# Patient Record
Sex: Female | Born: 1950 | Race: White | Hispanic: No | Smoking: Current every day smoker
Health system: Southern US, Community
[De-identification: ages and names within clinical notes are randomized; demographics above are authoritative.]

## PROBLEM LIST (undated history)

## (undated) DIAGNOSIS — N814 Uterovaginal prolapse, unspecified: Secondary | ICD-10-CM

---

## 2012-11-19 ENCOUNTER — Ambulatory Visit: Payer: Self-pay | Admitting: Family Medicine

## 2015-03-12 ENCOUNTER — Emergency Department: Payer: Self-pay

## 2015-03-12 ENCOUNTER — Emergency Department
Admission: EM | Admit: 2015-03-12 | Discharge: 2015-03-12 | Disposition: A | Discharge status: Home / Self Care | Payer: PRIVATE HEALTH INSURANCE | Attending: Emergency Medicine | Admitting: Emergency Medicine

## 2015-03-12 ENCOUNTER — Encounter: Payer: Self-pay | Admitting: Emergency Medicine

## 2015-03-12 ENCOUNTER — Ambulatory Visit
Admission: EM | Admit: 2015-03-12 | Discharge: 2015-03-12 | Disposition: A | Discharge status: Home / Self Care | Payer: PRIVATE HEALTH INSURANCE | Attending: Family Medicine | Admitting: Family Medicine

## 2015-03-12 DIAGNOSIS — M542 Cervicalgia: Secondary | ICD-10-CM

## 2015-03-12 DIAGNOSIS — Y92410 Unspecified street and highway as the place of occurrence of the external cause: Secondary | ICD-10-CM | POA: Insufficient documentation

## 2015-03-12 DIAGNOSIS — R51 Headache: Secondary | ICD-10-CM | POA: Diagnosis not present

## 2015-03-12 DIAGNOSIS — Z79899 Other long term (current) drug therapy: Secondary | ICD-10-CM | POA: Insufficient documentation

## 2015-03-12 DIAGNOSIS — Y998 Other external cause status: Secondary | ICD-10-CM | POA: Insufficient documentation

## 2015-03-12 DIAGNOSIS — Z720 Tobacco use: Secondary | ICD-10-CM | POA: Insufficient documentation

## 2015-03-12 DIAGNOSIS — S0990XA Unspecified injury of head, initial encounter: Secondary | ICD-10-CM

## 2015-03-12 DIAGNOSIS — S199XXA Unspecified injury of neck, initial encounter: Secondary | ICD-10-CM | POA: Insufficient documentation

## 2015-03-12 DIAGNOSIS — Y9389 Activity, other specified: Secondary | ICD-10-CM | POA: Insufficient documentation

## 2015-03-12 HISTORY — DX: Uterovaginal prolapse, unspecified: N81.4
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
## 2015-03-12 NOTE — ED Notes (Signed)
Dr. Kinner at the bedside for pt evaluation.  
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
## 2015-03-12 NOTE — Discharge Instructions (Signed)
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
## 2019-07-20 ENCOUNTER — Other Ambulatory Visit: Payer: Self-pay

## 2019-07-20 ENCOUNTER — Encounter: Payer: Self-pay | Admitting: Emergency Medicine

## 2019-07-20 ENCOUNTER — Ambulatory Visit
Admission: EM | Admit: 2019-07-20 | Discharge: 2019-07-20 | Disposition: A | Discharge status: Home / Self Care | Payer: Medicare Other | Attending: Family Medicine | Admitting: Family Medicine

## 2019-07-20 DIAGNOSIS — Z7189 Other specified counseling: Secondary | ICD-10-CM | POA: Diagnosis present

## 2019-07-20 DIAGNOSIS — F17210 Nicotine dependence, cigarettes, uncomplicated: Secondary | ICD-10-CM

## 2019-07-20 DIAGNOSIS — Z20828 Contact with and (suspected) exposure to other viral communicable diseases: Secondary | ICD-10-CM | POA: Insufficient documentation

## 2019-07-20 DIAGNOSIS — J069 Acute upper respiratory infection, unspecified: Secondary | ICD-10-CM

## 2019-07-20 DIAGNOSIS — Z20822 Contact with and (suspected) exposure to covid-19: Secondary | ICD-10-CM

## 2019-07-20 NOTE — Discharge Instructions (Signed)
Rest. Drink plenty of fluids.  ° °Follow up with your primary care physician this week as needed. Return to Urgent care for new or worsening concerns.  ° °

## 2019-07-20 NOTE — ED Triage Notes (Signed)
Patient states she was exposed to [place]. Patient states she is having a headache, cough, runny nose that started 2 days ago.

## 2019-07-20 NOTE — ED Provider Notes (Signed)
[facility] ____________________________________________  Time seen: Approximately 2:56 PM  I have reviewed the triage vital signs and the nursing notes.   HISTORY  Chief Complaint Cough, Headache, and Nasal Congestion   HPI Natalie Henry is a 68 y.o. female presenting for COVID-19 testing.  Patient reports that she does have mild nasal congestion, intermittent headache and cough, but further states this is not too atypical for her for this time a year.  However reports she wanted COVID-19 testing as her granddaughter who lives with her is also not feeling well.  Reports that her granddaughter did have a COVID-19 exposure but patient did not have a direct COVID-19 exposure.  Occasional scratchy throat.  Denies chest pain, shortness of breath, fevers, changes in taste or smell, vomiting or diarrhea.  Denies aggravating or alleviating factors.  States she has not needed take any medication over-the-counter for the same complaints.   Past Medical History:  Diagnosis Date  . Uterine prolapse     There are no problems to display for this patient.   History reviewed. No pertinent surgical history.   No current facility-administered medications for this encounter.  Current Outpatient Medications:  .  Ascorbic Acid (VITAMIN C) 1000 MG tablet, Take 2,000 mg by mouth daily., Disp: , Rfl:  .  Biotin 2500 MCG CAPS, Take 5,000 mcg by mouth every other day., Disp: , Rfl:  .  omega-3 acid ethyl esters (LOVAZA) 1 G capsule, Take 1,200 mg by mouth 2 (two) times daily., Disp: , Rfl:  .  Vitamin D, Ergocalciferol, (DRISDOL) 50000 UNITS CAPS capsule, Take 50,000 Units by mouth every other day., Disp: , Rfl:  .  zinc gluconate 50 MG tablet, Take 50 mg by mouth daily., Disp: , Rfl:   Allergies Prednisone  Family History  Problem Relation Age of Onset  . Diabetes Mother   . Cancer Father     Social History Social History   Tobacco Use  . Smoking status: Current Every Day  Smoker  . Smokeless tobacco: Never Used  Substance Use Topics  . Alcohol use: No  . Drug use: Never    Review of Systems Constitutional: No fever ENT:As above.  Cardiovascular: Denies chest pain. Respiratory: Denies shortness of breath. Gastrointestinal: No abdominal pain.  No nausea, no vomiting.  No diarrhea.  Musculoskeletal: Negative for back pain. Skin: Negative for rash.   ____________________________________________   PHYSICAL EXAM:  VITAL SIGNS: ED Triage Vitals  Enc Vitals Group     BP 07/20/19 1443 (!) 147/64     Pulse Rate 07/20/19 1443 74     Resp 07/20/19 1443 18     Temp 07/20/19 1443 98.2 F (36.8 C)     Temp src --      SpO2 07/20/19 1443 99 %     Weight 07/20/19 1441 160 lb (72.6 kg)     Height 07/20/19 1441 5\' 2"  (1.575 m)     Head Circumference --      Peak Flow --      Pain Score 07/20/19 1441 0     Pain Loc --      Pain Edu? --      Excl. in GC? --     Constitutional: Alert and oriented. Well appearing and in no acute distress. Eyes: Conjunctivae are normal Head: Atraumatic. No sinus tenderness to palpation. No swelling. No erythema.  Ears: no erythema, normal TMs bilaterally.   Nose:Nasal congestion   Mouth/Throat: Mucous membranes are moist. No pharyngeal erythema. No  tonsillar swelling or exudate.  Neck: No stridor.  No cervical spine tenderness to palpation. Hematological/Lymphatic/Immunilogical: No cervical lymphadenopathy. Cardiovascular: Normal rate, regular rhythm. Grossly normal heart sounds.  Good peripheral circulation. Respiratory: Normal respiratory effort.  No retractions. No wheezes, rales or rhonchi. Good air movement.  Musculoskeletal: Ambulatory with steady gait. Neurologic:  Normal speech and language. No gait instability. Skin:  Skin appears warm, dry and intact. No rash noted. Psychiatric: Mood and affect are normal. Speech and behavior are normal.  ___________________________________________   LABS (all labs ordered  are listed, but only abnormal results are displayed)  Labs Reviewed  NOVEL CORONAVIRUS, NAA (HOSP ORDER, SEND-OUT TO REF LAB; TAT 18-24 HRS)   ____________________________________________   PROCEDURES Procedures    INITIAL IMPRESSION / ASSESSMENT AND PLAN / ED COURSE  Pertinent labs & imaging results that were available during my care of the patient were reviewed by me and considered in my medical decision making (see chart for details).  Well-appearing patient.  No acute distress.  COVID-19 testing completed and advice given.  Work note given.  Rest, fluids, supportive care monitor.  Discussed follow up with Primary care physician this week as needed. Discussed follow up and return parameters including no resolution or any worsening concerns. Patient verbalized understanding and agreed to plan.   ____________________________________________   FINAL CLINICAL IMPRESSION(S) / ED DIAGNOSES  Final diagnoses:  Upper respiratory tract infection, unspecified type  Advice given about COVID-19 virus infection  Exposure to COVID-19 virus     ED Discharge Orders    None       Note: This dictation was prepared with Dragon dictation along with smaller phrase technology. Any transcriptional errors that result from this process are unintentional.         Marylene Land, NP 07/20/19 [phone]

## 2019-07-21 LAB — NOVEL CORONAVIRUS, NAA (HOSP ORDER, SEND-OUT TO REF LAB; TAT 18-24 HRS): SARS-CoV-2, NAA: NOT DETECTED

## 2019-11-18 ENCOUNTER — Ambulatory Visit
Admission: EM | Admit: 2019-11-18 | Discharge: 2019-11-18 | Disposition: A | Discharge status: Home / Self Care | Payer: Medicare Other | Attending: Family Medicine | Admitting: Family Medicine

## 2019-11-18 ENCOUNTER — Encounter: Payer: Self-pay | Admitting: Emergency Medicine

## 2019-11-18 ENCOUNTER — Other Ambulatory Visit: Payer: Self-pay

## 2019-11-18 DIAGNOSIS — Z20822 Contact with and (suspected) exposure to covid-19: Secondary | ICD-10-CM | POA: Diagnosis not present

## 2019-11-18 NOTE — ED Triage Notes (Signed)
Patient states that her coworker tested positive for COVID on Tuesday.  Patient reports some fatigue but denies cold symptoms.  Patient denies fevers.

## 2019-11-18 NOTE — Discharge Instructions (Addendum)
Self isolate and Await covid test

## 2019-11-18 NOTE — ED Provider Notes (Signed)
[facility]    CSN: [id number] Arrival date & time: 11/18/19  1322      History   Chief Complaint Chief Complaint  Patient presents with  . Fatigue    COVID Exposure    HPI Natalie Henry is a 69 y.o. female.   69 yo female here for covid test after exposure through covid positive co-worker this week. States she feels slightly fatigued but not significant and denies any other symptoms.      Past Medical History:  Diagnosis Date  . Uterine prolapse     There are no problems to display for this patient.   History reviewed. No pertinent surgical history.  OB History   No obstetric history on file.      Home Medications    Prior to Admission medications   Medication Sig Start Date End Date Taking? Authorizing Provider  Ascorbic Acid (VITAMIN C) 1000 MG tablet Take 2,000 mg by mouth daily.   Yes [provider]  Vitamin D, Ergocalciferol, (DRISDOL) 50000 UNITS CAPS capsule Take 50,000 Units by mouth every other day.   Yes [provider]  Biotin 2500 MCG CAPS Take 5,000 mcg by mouth every other day.    [provider]  omega-3 acid ethyl esters (LOVAZA) 1 G capsule Take 1,200 mg by mouth 2 (two) times daily.    [provider]  zinc gluconate 50 MG tablet Take 50 mg by mouth daily.    [provider]    Family History Family History  Problem Relation Age of Onset  . Diabetes Mother   . Cancer Father     Social History Social History   Tobacco Use  . Smoking status: Current Every Day Smoker    Types: Cigarettes  . Smokeless tobacco: Never Used  Substance Use Topics  . Alcohol use: No  . Drug use: Never     Allergies   Prednisone   Review of Systems Review of Systems   Physical Exam Triage Vital Signs ED Triage Vitals  Enc Vitals Group     BP 11/18/19 1338 (!) 142/62     Pulse Rate 11/18/19 1338 71     Resp 11/18/19 1338 14     Temp 11/18/19 1338 98 F (36.7 C)     Temp Source  11/18/19 1338 Oral     SpO2 11/18/19 1338 100 %     Weight 11/18/19 1336 170 lb (77.1 kg)     Height 11/18/19 1336 5\' 2"  (1.575 m)     Head Circumference --      Peak Flow --      Pain Score 11/18/19 1336 0     Pain Loc --      Pain Edu? --      Excl. in [place]? --    No data found.  Updated Vital Signs BP (!) 142/62 (BP Location: Left Arm)   Pulse 71   Temp 98 F (36.7 C) (Oral)   Resp 14   Ht 5\' 2"  (1.575 m)   Wt 77.1 kg   SpO2 100%   BMI 31.09 kg/m   Visual Acuity Right Eye Distance:   Left Eye Distance:   Bilateral Distance:    Right Eye Near:   Left Eye Near:    Bilateral Near:     Physical Exam Vitals and nursing note reviewed.  Constitutional:      General: She is not in acute distress.    Appearance: Normal appearance. She is not  ill-appearing, toxic-appearing or diaphoretic.  Pulmonary:     Effort: Pulmonary effort is normal.  Neurological:     Mental Status: She is alert.      UC Treatments / Results  Labs (all labs ordered are listed, but only abnormal results are displayed) Labs Reviewed  SARS CORONAVIRUS 2 (TAT 6-24 HRS)    EKG   Radiology No results found.  Procedures Procedures (including critical care time)  Medications Ordered in UC Medications - No data to display  Initial Impression / Assessment and Plan / UC Course  I have reviewed the triage vital signs and the nursing notes.  Pertinent labs & imaging results that were available during my care of the patient were reviewed by me and considered in my medical decision making (see chart for details).      Final Clinical Impressions(s) / UC Diagnoses   Final diagnoses:  Exposure to COVID-19 virus     Discharge Instructions     Self isolate and Await covid test    ED Prescriptions    None      1.  diagnosis reviewed with patien 2. Await covid test 3. Follow-up prn  PDMP not reviewed this encounter.   Payton Mccallum, MD 11/18/19 [phone]

## 2019-11-19 LAB — SARS CORONAVIRUS 2 (TAT 6-24 HRS): SARS Coronavirus 2: NEGATIVE

## 2020-07-13 ENCOUNTER — Encounter: Payer: Self-pay | Admitting: Emergency Medicine

## 2020-07-13 ENCOUNTER — Ambulatory Visit
Admission: EM | Admit: 2020-07-13 | Discharge: 2020-07-13 | Disposition: A | Discharge status: Home / Self Care | Payer: Medicare Other | Attending: Physician Assistant | Admitting: Physician Assistant

## 2020-07-13 ENCOUNTER — Other Ambulatory Visit: Payer: Self-pay

## 2020-07-13 DIAGNOSIS — H1012 Acute atopic conjunctivitis, left eye: Secondary | ICD-10-CM | POA: Diagnosis not present

## 2020-07-13 MED ORDER — NAPHAZOLINE-PHENIRAMINE 0.025-0.3 % OP SOLN: 2.0000 [drp] | Freq: Four times a day (QID) | OPHTHALMIC | 0 refills | Status: AC | PRN

## 2020-07-13 NOTE — ED Triage Notes (Signed)
Pt c/o left eye swelling, watering and itching. Started yesterday. She has not tried anything.

## 2020-07-13 NOTE — ED Provider Notes (Signed)
[facility]    CSN: [id number] Arrival date & time: 07/13/20  1159      History   Chief Complaint Chief Complaint  Patient presents with  . Eye Problem    left    HPI Natalie Henry is a 69 y.o. female presenting for concerns of left eye redness and itching since yesterday.  Patient admits to some clear to light yellow drainage and crusting.  She denies any photosensitivity.  She states it feels like her eye aches a little bit.  Denies pain with movement of the eye.  Denies tenderness of the eyelids.  Denies any vision changes.  Denies headaches or dizziness.  Patient denies any fever, fatigue, nasal congestion, cough or sore throat.  Patient believes she may have "pinkeye."  She has not tried any over-the-counter eyedrops or other medications for symptoms.  Patient is not a contact lens wearer.  She has no other complaints or concerns.  HPI  Past Medical History:  Diagnosis Date  . Uterine prolapse     There are no problems to display for this patient.   History reviewed. No pertinent surgical history.  OB History   No obstetric history on file.      Home Medications    Prior to Admission medications   Medication Sig Start Date End Date Taking? Authorizing Provider  Ascorbic Acid (VITAMIN C) 1000 MG tablet Take 2,000 mg by mouth daily.   Yes [provider]  Biotin 2500 MCG CAPS Take 5,000 mcg by mouth every other day.   Yes [provider]  naphazoline-pheniramine (NAPHCON-A) 0.025-0.3 % ophthalmic solution Place 2 drops into the left eye 4 (four) times daily as needed for up to 7 days for eye irritation. 07/13/20 07/20/20  Eusebio Friendly B, PA-C  omega-3 acid ethyl esters (LOVAZA) 1 G capsule Take 1,200 mg by mouth 2 (two) times daily.    [provider]  Vitamin D, Ergocalciferol, (DRISDOL) 50000 UNITS CAPS capsule Take 50,000 Units by mouth every other day.    [provider]  zinc gluconate 50 MG tablet Take 50 mg by  mouth daily.    [provider]    Family History Family History  Problem Relation Age of Onset  . Diabetes Mother   . Cancer Father     Social History Social History   Tobacco Use  . Smoking status: Current Every Day Smoker    Types: Cigarettes  . Smokeless tobacco: Never Used  Vaping Use  . Vaping Use: Never used  Substance Use Topics  . Alcohol use: No  . Drug use: Never     Allergies   Prednisone   Review of Systems Review of Systems  Constitutional: Negative for fatigue and fever.  HENT: Negative for congestion, rhinorrhea and sore throat.   Eyes: Positive for pain, discharge, redness and itching. Negative for photophobia and visual disturbance.  Respiratory: Negative for cough and shortness of breath.   Gastrointestinal: Negative for nausea and vomiting.  Skin: Negative for rash.  Neurological: Negative for dizziness, weakness and headaches.  Hematological: Negative for adenopathy.     Physical Exam Triage Vital Signs ED Triage Vitals  Enc Vitals Group     BP 07/13/20 1227 (!) 159/69     Pulse Rate 07/13/20 1227 77     Resp 07/13/20 1227 18     Temp 07/13/20 1227 97.8 F (36.6 C)     Temp Source 07/13/20 1227 Oral     SpO2 07/13/20 1227  99 %     Weight 07/13/20 1225 169 lb 15.6 oz (77.1 kg)     Height 07/13/20 1225 5\' 2"  (1.575 m)     Head Circumference --      Peak Flow --      Pain Score 07/13/20 1225 0     Pain Loc --      Pain Edu? --      Excl. in [place]? --    No data found.  Updated Vital Signs BP (!) 159/69 (BP Location: Left Arm)   Pulse 77   Temp 97.8 F (36.6 C) (Oral)   Resp 18   Ht 5\' 2"  (1.575 m)   Wt 169 lb 15.6 oz (77.1 kg)   SpO2 99%   BMI 31.09 kg/m   Visual Acuity Right Eye Distance: 20/30 uncorrected Left Eye Distance: 20/40 uncorrected Bilateral Distance: 20/40 uncorrected  Right Eye Near:   Left Eye Near:    Bilateral Near:     Physical Exam Vitals and nursing note reviewed.  Constitutional:       General: She is not in acute distress.    Appearance: Normal appearance. She is not ill-appearing or toxic-appearing.  HENT:     Head: Normocephalic and atraumatic.     Nose: Nose normal.     Mouth/Throat:     Mouth: Mucous membranes are moist.     Pharynx: Oropharynx is clear.  Eyes:     General: Lids are normal. Vision grossly intact. No scleral icterus.       Left eye: No foreign body or discharge.     Extraocular Movements:     Left eye: Normal extraocular motion.     Conjunctiva/sclera:     Left eye: Left conjunctiva is injected (slight injection medial corner with slight erythema, no drainage.).     Pupils: Pupils are equal, round, and reactive to light.  Cardiovascular:     Rate and Rhythm: Normal rate and regular rhythm.     Heart sounds: Normal heart sounds.  Pulmonary:     Effort: Pulmonary effort is normal. No respiratory distress.     Breath sounds: Normal breath sounds.  Musculoskeletal:     Cervical back: Neck supple.  Skin:    General: Skin is dry.  Neurological:     General: No focal deficit present.     Mental Status: She is alert. Mental status is at baseline.     Motor: No weakness.     Gait: Gait normal.  Psychiatric:        Mood and Affect: Mood normal.        Behavior: Behavior normal.        Thought Content: Thought content normal.      UC Treatments / Results  Labs (all labs ordered are listed, but only abnormal results are displayed) Labs Reviewed - No data to display  EKG   Radiology No results found.  Procedures Procedures (including critical care time)  Medications Ordered in UC Medications - No data to display  Initial Impression / Assessment and Plan / UC Course  I have reviewed the triage vital signs and the nursing notes.  Pertinent labs & imaging results that were available during my care of the patient were reviewed by me and considered in my medical decision making (see chart for details).   Patient's exam and symptoms  are most consistent with allergic conjunctivitis.  I do not suspect bacterial conjunctivitis at this time.  I have sent Naphcon-A to pharmacy.  Advised her to call or return to clinic if any symptoms are worsening.  ED precautions reviewed.  Final Clinical Impressions(s) / UC Diagnoses   Final diagnoses:  Allergic conjunctivitis of left eye   Discharge Instructions   None    ED Prescriptions    Medication Sig Dispense Auth. Provider   naphazoline-pheniramine (NAPHCON-A) 0.025-0.3 % ophthalmic solution Place 2 drops into the left eye 4 (four) times daily as needed for up to 7 days for eye irritation. 15 mL Shirlee Latch, PA-C     PDMP not reviewed this encounter.   Shirlee Latch, PA-C 07/13/20 1248

## 2020-08-12 ENCOUNTER — Other Ambulatory Visit: Payer: Self-pay

## 2020-08-12 ENCOUNTER — Ambulatory Visit
Admission: EM | Admit: 2020-08-12 | Discharge: 2020-08-12 | Disposition: A | Discharge status: Home / Self Care | Payer: Medicare Other | Attending: Physician Assistant | Admitting: Physician Assistant

## 2020-08-12 ENCOUNTER — Encounter: Payer: Self-pay | Admitting: Emergency Medicine

## 2020-08-12 DIAGNOSIS — U071 COVID-19: Secondary | ICD-10-CM | POA: Insufficient documentation

## 2020-08-12 DIAGNOSIS — R5383 Other fatigue: Secondary | ICD-10-CM | POA: Insufficient documentation

## 2020-08-12 DIAGNOSIS — J3489 Other specified disorders of nose and nasal sinuses: Secondary | ICD-10-CM | POA: Diagnosis present

## 2020-08-12 DIAGNOSIS — Z20822 Contact with and (suspected) exposure to covid-19: Secondary | ICD-10-CM | POA: Diagnosis present

## 2020-08-12 DIAGNOSIS — B349 Viral infection, unspecified: Secondary | ICD-10-CM | POA: Insufficient documentation

## 2020-08-12 NOTE — Discharge Instructions (Addendum)

## 2020-08-12 NOTE — ED Provider Notes (Signed)
[facility]    CSN: [id number] Arrival date & time: 08/12/20  1311      History   Chief Complaint Chief Complaint  Patient presents with   Sinus Problem    HPI KEELYN MONJARAS is a 70 y.o. female presenting for 3-day history of nasal congestion, sinus pressure and low-grade fevers. Also admits to headaches and pain of the right side of her neck behind her right ear. Admits to temps up to 99.7 degrees. Patient says that her grandson who lives with her house COVID-19. She states she has been fully vaccinated for COVID-19.  Patient denies any high-grade fever, cough, chest pain, difficulty breathing, weakness, pain, nausea/vomiting or diarrhea.  Patient denies any chronic health problems.  No other current concerns.  HPI  Past Medical History:  Diagnosis Date   Uterine prolapse     There are no problems to display for this patient.   History reviewed. No pertinent surgical history.  OB History   No obstetric history on file.      Home Medications    Prior to Admission medications   Medication Sig Start Date End Date Taking? Authorizing Provider  Ascorbic Acid (VITAMIN C) 1000 MG tablet Take 2,000 mg by mouth daily.   Yes [provider]  Biotin 2500 MCG CAPS Take 5,000 mcg by mouth every other day.    [provider]  omega-3 acid ethyl esters (LOVAZA) 1 G capsule Take 1,200 mg by mouth 2 (two) times daily.    [provider]  Vitamin D, Ergocalciferol, (DRISDOL) 50000 UNITS CAPS capsule Take 50,000 Units by mouth every other day.    [provider]  zinc gluconate 50 MG tablet Take 50 mg by mouth daily.    [provider]    Family History Family History  Problem Relation Age of Onset   Diabetes Mother    Cancer Father     Social History Social History   Tobacco Use   Smoking status: Current Every Day Smoker    Types: Cigarettes   Smokeless tobacco: Never Used  Building services engineer Use: Never used   Substance Use Topics   Alcohol use: No   Drug use: Never     Allergies   Prednisone   Review of Systems Review of Systems  Constitutional: Positive for fatigue. Negative for chills, diaphoresis and fever.  HENT: Positive for congestion, rhinorrhea and sinus pressure. Negative for ear pain, sinus pain and sore throat.   Respiratory: Negative for cough and shortness of breath.   Cardiovascular: Negative for chest pain.  Gastrointestinal: Negative for abdominal pain, nausea and vomiting.  Musculoskeletal: Negative for arthralgias and myalgias.  Skin: Negative for rash.  Neurological: Positive for headaches. Negative for weakness.  Hematological: Negative for adenopathy.     Physical Exam Triage Vital Signs ED Triage Vitals  Enc Vitals Group     BP 08/12/20 1352 128/64     Pulse Rate 08/12/20 1352 63     Resp 08/12/20 1352 14     Temp 08/12/20 1352 97.7 F (36.5 C)     Temp Source 08/12/20 1352 Oral     SpO2 08/12/20 1352 100 %     Weight 08/12/20 1349 170 lb (77.1 kg)     Height 08/12/20 1349 5\' 2"  (1.575 m)     Head Circumference --      Peak Flow --      Pain Score 08/12/20 1349 5     Pain Loc --  Pain Edu? --      Excl. in GC? --    No data found.  Updated Vital Signs BP 128/64 (BP Location: Left Arm)    Pulse 63    Temp 97.7 F (36.5 C) (Oral)    Resp 14    Ht 5\' 2"  (1.575 m)    Wt 170 lb (77.1 kg)    SpO2 100%    BMI 31.09 kg/m       Physical Exam Vitals and nursing note reviewed.  Constitutional:      General: She is not in acute distress.    Appearance: Normal appearance. She is not ill-appearing or toxic-appearing.  HENT:     Head: Normocephalic and atraumatic.     Right Ear: Tympanic membrane, ear canal and external ear normal.     Left Ear: Tympanic membrane, ear canal and external ear normal.     Nose: Congestion and rhinorrhea present.     Mouth/Throat:     Mouth: Mucous membranes are moist.     Pharynx: Oropharynx is clear. No  posterior oropharyngeal erythema.  Eyes:     General: No scleral icterus.       Right eye: No discharge.        Left eye: No discharge.     Conjunctiva/sclera: Conjunctivae normal.  Cardiovascular:     Rate and Rhythm: Normal rate and regular rhythm.     Heart sounds: Normal heart sounds.  Pulmonary:     Effort: Pulmonary effort is normal. No respiratory distress.     Breath sounds: Normal breath sounds.  Musculoskeletal:     Cervical back: Neck supple.  Lymphadenopathy:     Cervical: No cervical adenopathy.  Skin:    General: Skin is dry.  Neurological:     General: No focal deficit present.     Mental Status: She is alert. Mental status is at baseline.     Motor: No weakness.     Gait: Gait normal.  Psychiatric:        Mood and Affect: Mood normal.        Behavior: Behavior normal.        Thought Content: Thought content normal.      UC Treatments / Results  Labs (all labs ordered are listed, but only abnormal results are displayed) Labs Reviewed  SARS CORONAVIRUS 2 (TAT 6-24 HRS)    EKG   Radiology No results found.  Procedures Procedures (including critical care time)  Medications Ordered in UC Medications - No data to display  Initial Impression / Assessment and Plan / UC Course  I have reviewed the triage vital signs and the nursing notes.  Pertinent labs & imaging results that were available during my care of the patient were reviewed by me and considered in my medical decision making (see chart for details).   All vital signs are normal and stable.  She is not ill-appearing.  Suspect viral illness.  Advised patient that he very well could have COVID-19 given his symptoms and exposure to her grandson who is positive.  COVID testing obtained.  Patient already had x-ray results.  CDC guidelines, isolation GERD, and ED precautions reviewed with patient.  Advised supportive care with increasing rest and fluids.  Advised to get over-the-counter Mucinex.   Advised to follow-up with our clinic as needed.   Final Clinical Impressions(s) / UC Diagnoses   Final diagnoses:  Viral illness  Sinus pressure  Fatigue, unspecified type  Exposure to COVID-19 virus  Discharge Instructions     You have received COVID testing today either for positive exposure, concerning symptoms that could be related to COVID infection, screening purposes, or re-testing after confirmed positive.  Your test obtained today checks for active viral infection in the last 1-2 weeks. If your test is negative now, you can still test positive later. So, if you do develop symptoms you should either get re-tested and/or isolate x 5 days and then strict mask use x 5 days (unvaccinated) or mask use x 10 days (vaccinated). Please follow CDC guidelines.  While Rapid antigen tests come back in 15-20 minutes, send out PCR/molecular test results typically come back within 1-3 days. In the mean time, if you are symptomatic, assume this could be a positive test and treat/monitor yourself as if you do have COVID.   We will call with test results if positive. Please download the MyChart app and set up a profile to access test results.   If symptomatic, go home and rest. Push fluids. Take Tylenol as needed for discomfort. Gargle warm salt water. Throat lozenges. Take Mucinex DM or Robitussin for cough. Humidifier in bedroom to ease coughing. Warm showers. Also review the COVID handout for more information.  COVID-19 INFECTION: The incubation period of COVID-19 is approximately 14 days after exposure, with most symptoms developing in roughly 4-5 days. Symptoms may range in severity from mild to critically severe. Roughly 80% of those infected will have mild symptoms. People of any age may become infected with COVID-19 and have the ability to transmit the virus. The most common symptoms include: fever, fatigue, cough, body aches, headaches, sore throat, nasal congestion, shortness of breath,  nausea, vomiting, diarrhea, changes in smell and/or taste.    COURSE OF ILLNESS Some patients may begin with mild disease which can progress quickly into critical symptoms. If your symptoms are worsening please call ahead to the Emergency Department and proceed there for further treatment. Recovery time appears to be roughly 1-2 weeks for mild symptoms and 3-6 weeks for severe disease.   GO IMMEDIATELY TO ER FOR FEVER YOU ARE UNABLE TO GET DOWN WITH TYLENOL, BREATHING PROBLEMS, CHEST PAIN, FATIGUE, LETHARGY, INABILITY TO EAT OR DRINK, ETC  QUARANTINE AND ISOLATION: To help decrease the spread of COVID-19 please remain isolated if you have COVID infection or are highly suspected to have COVID infection. This means -stay home and isolate to one room in the home if you live with others. Do not share a bed or bathroom with others while ill, sanitize and wipe down all countertops and keep common areas clean and disinfected. Stay home for 5 days. If you have no symptoms or your symptoms are resolving after 5 days, you can leave your house. Continue to wear a mask around others for 5 additional days. If you have been in close contact (within 6 feet) of someone diagnosed with COVID 19, you are advised to quarantine in your home for 14 days as symptoms can develop anywhere from 2-14 days after exposure to the virus. If you develop symptoms, you  must isolate.  Most current guidelines for COVID after exposure -unvaccinated: isolate 5 days and strict mask use x 5 days. Test on day 5 is possible -vaccinated: wear mask x 10 days if symptoms do not develop -You do not necessarily need to be tested for COVID if you have + exposure and  develop symptoms. Just isolate at home x10 days from symptom onset During this global pandemic, CDC advises to practice social  distancing, try to stay at least 63ft away from others at all times. Wear a face covering. Wash and sanitize your hands regularly and avoid going anywhere that  is not necessary.  KEEP IN MIND THAT THE COVID TEST IS NOT 100% ACCURATE AND YOU SHOULD STILL DO EVERYTHING TO PREVENT POTENTIAL SPREAD OF VIRUS TO OTHERS (WEAR MASK, WEAR GLOVES, WASH HANDS AND SANITIZE REGULARLY). IF INITIAL TEST IS NEGATIVE, THIS MAY NOT MEAN YOU ARE DEFINITELY NEGATIVE. MOST ACCURATE TESTING IS DONE 5-7 DAYS AFTER EXPOSURE.   It is not advised by CDC to get re-tested after receiving a positive COVID test since you can still test positive for weeks to months after you have already cleared the virus.   *If you have not been vaccinated for COVID, I strongly suggest you consider getting vaccinated as long as there are no contraindications.      ED Prescriptions    None     PDMP not reviewed this encounter.   Shirlee Latch, PA-C 08/14/20 1111

## 2020-08-12 NOTE — ED Triage Notes (Signed)
Patient sinus congestion and pressure and low grade fever that started 3 days ago.

## 2020-08-13 LAB — SARS CORONAVIRUS 2 (TAT 6-24 HRS): SARS Coronavirus 2: POSITIVE — AB

## 2022-07-04 ENCOUNTER — Ambulatory Visit
Admission: EM | Admit: 2022-07-04 | Discharge: 2022-07-04 | Disposition: A | Discharge status: Home / Self Care | Payer: Medicare Other

## 2022-07-04 ENCOUNTER — Encounter: Payer: Self-pay | Admitting: Emergency Medicine

## 2022-07-04 DIAGNOSIS — R079 Chest pain, unspecified: Secondary | ICD-10-CM

## 2022-07-04 NOTE — Discharge Instructions (Addendum)
Recommended patient obtain a home blood pressure cuff to monitor her symptoms and to inform her vascular surgeon of her findings.

## 2022-07-04 NOTE — ED Triage Notes (Signed)
Patient states that she was discharged from [facility] on Wednesday. Patient states that she was diagnosed with a ulcerated aorta and discharged home on Carvedilol.  Patient states that she was given this in the hospital.  Patient states that since taking the medicine at home she started having persistent chest pain and dizziness.

## 2022-07-04 NOTE — ED Provider Notes (Addendum)
[facility]    CSN: [id number] Arrival date & time: 07/04/22  1559      History   Chief Complaint Chief Complaint  Patient presents with   Chest Pain    HPI Natalie Henry is a 71 y.o. female.    Chest Pain   Patient presents to urgent care following discharge from [facility] on Wednesday.  She has concerns about the dose of a blood pressure medication she was given on discharge.  Patient states she was diagnosed with "ulcerated aorta" and discharged on carvedilol.  She endorses feeling of dizziness and a persistent "feeling" in her chest that she describes as tingling.  She has not measured blood pressure at home.  Blood pressure measured in clinic is 156/64, SpO2 equals 99.  Heart rate 72.  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  She has scheduled follow-up with vascular surgery on 08/01/2022.  Past Medical History:  Diagnosis Date   Uterine prolapse     There are no problems to display for this patient.   History reviewed. No pertinent surgical history.  OB History   No obstetric history on file.      Home Medications    Prior to Admission medications   Medication Sig Start Date End Date Taking? Authorizing Provider  aspirin 81 MG chewable tablet Chew by mouth. 07/03/22 08/02/22 Yes [provider]  carvedilol (COREG) 6.25 MG tablet Take by mouth. 07/02/22 08/01/22 Yes [provider]  Ascorbic Acid (VITAMIN C) 1000 MG tablet Take 2,000 mg by mouth daily.    [provider]  Biotin 2500 MCG CAPS Take 5,000 mcg by mouth every other day.    [provider]  omega-3 acid ethyl esters (LOVAZA) 1 G capsule Take 1,200 mg by mouth 2 (two) times daily.    [provider]  Vitamin D, Ergocalciferol, (DRISDOL) 50000 UNITS CAPS capsule Take 50,000 Units by mouth every other day.    [provider]  zinc gluconate 50 MG tablet Take 50 mg by mouth daily.    [provider]    Family History Family History  Problem Relation Age of Onset   Diabetes Mother    Cancer Father     Social History Social History   Tobacco Use   Smoking status: Every Day    Types: Cigarettes   Smokeless tobacco: Never  Vaping Use   Vaping Use: Never used  Substance Use Topics   Alcohol use: No   Drug use: Never     Allergies   Prednisone   Review of Systems Review of Systems  Cardiovascular:  Positive for chest pain.     Physical Exam Triage Vital Signs ED Triage Vitals  Enc Vitals Group     BP 07/04/22 1630 (!) 156/64     Pulse Rate 07/04/22 1630 72     Resp 07/04/22 1630 14     Temp 07/04/22 1630 98.4 F (36.9 C)     Temp Source 07/04/22 1630 Oral     SpO2 07/04/22 1630 99 %     Weight 07/04/22 1628 169 lb 15.6 oz (77.1 kg)     Height 07/04/22 1628 5\' 2"  (1.575 m)     Head Circumference --      Peak Flow --      Pain Score 07/04/22 1627 2     Pain Loc --      Pain Edu? --      Excl. in [place]? --  No data found.  Updated Vital Signs BP (!) 156/64 (BP Location: Left Arm)   Pulse 72   Temp 98.4 F (36.9 C) (Oral)   Resp 14   Ht 5\' 2"  (1.575 m)   Wt 169 lb 15.6 oz (77.1 kg)   SpO2 99%   BMI 31.09 kg/m   Visual Acuity Right Eye Distance:   Left Eye Distance:   Bilateral Distance:    Right Eye Near:   Left Eye Near:    Bilateral Near:     Physical Exam Vitals reviewed.  Constitutional:      Appearance: She is well-developed.  Skin:    General: Skin is warm and dry.  Neurological:     General: No focal deficit present.     Mental Status: She is alert and oriented to person, place, and time.  Psychiatric:        Mood and Affect: Mood normal.        Behavior: Behavior normal.      UC Treatments / Results  Labs (all labs ordered are listed, but only abnormal results are displayed) Labs Reviewed - No data to display  EKG   Radiology No results found.  Procedures Procedures (including critical care  time)  Medications Ordered in UC Medications - No data to display  Initial Impression / Assessment and Plan / UC Course  I have reviewed the triage vital signs and the nursing notes.  Pertinent labs & imaging results that were available during my care of the patient were reviewed by me and considered in my medical decision making (see chart for details).   Reviewed results of EKG with patient here showing sinus rhythm with arrhythmia.  Informed patient that this is likely a reassuring test however I had no other EKG to compare with.  I discussed carvedilol, that it is a beta-blocker used to reduce the heart rate and stress on the heart as well as maintain safe blood pressure.  Patient expressed concern that she is on too high dose.  I showed her that her heart rate is 70/72 and we usually will hold the beta-blocker only if heart rate is less than 60.  I also showed her that her blood pressure measured here in clinic was 156/62 which is probably a little higher than her providers would prefer, thus it did not appear as if the dose was too high.  I suggested that she obtain a blood pressure cuff at the pharmacy to use at home and measure her blood pressure in a quiet environment.  I also suggested that she measure her blood pressure when she was having symptoms of dizziness.  Recommended she contact the vascular surgeon with her findings if she continued to be concerned.   Final Clinical Impressions(s) / UC Diagnoses   Final diagnoses:  None   Discharge Instructions   None    ED Prescriptions   None    PDMP not reviewed this encounter.   , FNP 07/04/22 1659    14/01/23, FNP 07/04/22 1700

## 2022-09-29 ENCOUNTER — Encounter: Payer: Self-pay | Admitting: Family Medicine
